# Patient Record
Sex: Male | Born: 2000 | Race: White | Hispanic: No | Marital: Single | Smoking: Never smoker
Health system: Southern US, Community
[De-identification: ages and names within clinical notes are randomized; demographics above are authoritative.]

## PROBLEM LIST (undated history)

## (undated) HISTORY — PX: TONSILLECTOMY: SUR1361

---
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
## 2019-11-15 ENCOUNTER — Emergency Department: Payer: Worker's Compensation

## 2019-11-15 ENCOUNTER — Emergency Department
Admission: EM | Admit: 2019-11-15 | Discharge: 2019-11-15 | Disposition: A | Discharge status: Home / Self Care | Payer: Worker's Compensation | Attending: Emergency Medicine | Admitting: Emergency Medicine

## 2019-11-15 ENCOUNTER — Encounter: Payer: Self-pay | Admitting: Emergency Medicine

## 2019-11-15 ENCOUNTER — Other Ambulatory Visit: Payer: Self-pay

## 2019-11-15 DIAGNOSIS — W228XXA Striking against or struck by other objects, initial encounter: Secondary | ICD-10-CM | POA: Insufficient documentation

## 2019-11-15 DIAGNOSIS — S0003XA Contusion of scalp, initial encounter: Secondary | ICD-10-CM | POA: Insufficient documentation

## 2019-11-15 DIAGNOSIS — Y990 Civilian activity done for income or pay: Secondary | ICD-10-CM | POA: Diagnosis not present

## 2019-11-15 DIAGNOSIS — Y9289 Other specified places as the place of occurrence of the external cause: Secondary | ICD-10-CM | POA: Diagnosis not present

## 2019-11-15 DIAGNOSIS — Y9389 Activity, other specified: Secondary | ICD-10-CM | POA: Diagnosis not present

## 2019-11-15 DIAGNOSIS — S060X0A Concussion without loss of consciousness, initial encounter: Secondary | ICD-10-CM | POA: Insufficient documentation

## 2019-11-15 DIAGNOSIS — S0990XA Unspecified injury of head, initial encounter: Secondary | ICD-10-CM | POA: Diagnosis present

## 2019-11-15 MED ORDER — ONDANSETRON 4 MG PO TBDP
4.0000 mg | ORAL_TABLET | Freq: Three times a day (TID) | ORAL | 0 refills | Status: AC | PRN
Start: 2019-11-15 — End: ?

## 2019-11-15 MED ORDER — ONDANSETRON 4 MG PO TBDP
4.0000 mg | ORAL_TABLET | Freq: Once | ORAL | Status: AC
  Administered 2019-11-15: 4 mg via ORAL
  Filled 2019-11-15: qty 1

## 2019-11-15 MED ORDER — MECLIZINE HCL 25 MG PO TABS: 25.0000 mg | ORAL_TABLET | Freq: Three times a day (TID) | ORAL | 0 refills | Status: AC | PRN

## 2019-11-15 NOTE — ED Triage Notes (Signed)
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 No LOC. Is having headaches and nausea so came to get checked. No blood thinners. No profile on file for company.

## 2019-11-15 NOTE — ED Notes (Signed)
See triage note states he hit his head on a door at work yesterday  No LOC  conts to have h/a

## 2019-11-15 NOTE — Discharge Instructions (Addendum)
Follow-up with your primary care provider, [facility] acute care or return to the emergency department if any continued problems with headaches.  Begin taking Zofran as needed for nausea.  Antivert is 3 times a day if needed for dizziness.  You may also take Tylenol if needed for headache.  Drink lots of fluids.  You may need to avoid bright light, texting on your cell phone, watching TV, or playing video games in order for your headache to improve.  You are out of work for the next 2 days to avoid increased chances of injury due to your headache and dizziness.  If there is any worsening of your symptoms or dizziness is not improving return to the emergency department.

## 2019-11-15 NOTE — ED Provider Notes (Signed)
[facility] Emergency Department Provider Note   ____________________________________________   First MD Initiated Contact with Patient 11/15/19 1044     (approximate)  I have reviewed the triage vital signs and the nursing notes.   HISTORY  Chief Complaint Head Injury   HPI DVON JILES is a 19 y.o. male presents to the ED with complaint of headache, nausea without vomiting and dizziness.  Patient states that while at work yesterday a sliding door hit him to the posterior aspect of his head.  He denies any LOC.  He has continued to have headaches since that time.  Patient is not currently taking blood thinners.       History reviewed. No pertinent past medical history.  There are no problems to display for this patient.   Past Surgical History:  Procedure Laterality Date  . TONSILLECTOMY      Prior to Admission medications   Medication Sig Start Date End Date Taking? Authorizing Provider  meclizine (ANTIVERT) 25 MG tablet Take 1 tablet (25 mg total) by mouth 3 (three) times daily as needed for dizziness. 11/15/19   Tommi Rumps, PA-C  ondansetron (ZOFRAN ODT) 4 MG disintegrating tablet Take 1 tablet (4 mg total) by mouth every 8 (eight) hours as needed for nausea or vomiting. 11/15/19   Tommi Rumps, PA-C    Allergies Amoxicillin-pot clavulanate and Cephalexin  History reviewed. No pertinent family history.  Social History Social History   Tobacco Use  . Smoking status: Never Smoker  . Smokeless tobacco: Never Used  Substance Use Topics  . Alcohol use: Never  . Drug use: Never    Review of Systems Constitutional: No fever/chills Eyes: No visual changes. ENT: No trauma. Cardiovascular: Denies chest pain. Respiratory: Denies shortness of breath. Gastrointestinal: No abdominal pain.  Positive nausea, no vomiting.  Musculoskeletal: Negative for muscle aches. Skin: Negative for rash. Neurological: Positive for headaches,  negative for focal weakness or numbness. ____________________________________________   PHYSICAL EXAM:  VITAL SIGNS: ED Triage Vitals  Enc Vitals Group     BP 11/15/19 1038 (!) 142/84     Pulse Rate 11/15/19 1038 71     Resp 11/15/19 1038 18     Temp 11/15/19 1038 99.2 F (37.3 C)     Temp Source 11/15/19 1038 Oral     SpO2 11/15/19 1038 99 %     Weight 11/15/19 1039 220 lb (99.8 kg)     Height 11/15/19 1039 6\' 1"  (1.854 m)     Head Circumference --      Peak Flow --      Pain Score 11/15/19 1038 0     Pain Loc --      Pain Edu? --      Excl. in GC? --     Constitutional: Alert and oriented. Well appearing and in no acute distress. Eyes: Conjunctivae are normal. PERRL. EOMI. mild nystagmus was noted. Head: Atraumatic. Nose: No trauma. Neck: No stridor.  No cervical tenderness on palpation posteriorly. Cardiovascular: Normal rate, regular rhythm. Grossly normal heart sounds.  Good peripheral circulation. Respiratory: Normal respiratory effort.  No retractions. Lungs CTAB. Gastrointestinal: Soft and nontender. No distention.  Musculoskeletal: Moves upper and lower extremities without any difficulty.  Normal gait was noted. Neurologic:  Normal speech and language. No gross focal neurologic deficits are appreciated.  Cranial nerves II through XII grossly intact.  No gait instability. Skin:  Skin is warm, dry and intact.  Tender left posterior scalp without abrasion  or hematoma. Psychiatric: Mood and affect are normal. Speech and behavior are normal.  ____________________________________________   LABS (all labs ordered are listed, but only abnormal results are displayed)  Labs Reviewed - No data to display RADIOLOGY   Official radiology report(s): CT Head Wo Contrast  Result Date: 11/15/2019 CLINICAL DATA:  Headache. Traumatic head injury yesterday. Hit by sliding door at work. EXAM: CT HEAD WITHOUT CONTRAST TECHNIQUE: Contiguous axial images were obtained from the base  of the skull through the vertex without intravenous contrast. COMPARISON:  CT head without contrast 04/28/2018 FINDINGS: Brain: No acute infarct, hemorrhage, or mass lesion is present. No significant white matter lesions are present. The ventricles are of normal size. No significant extraaxial fluid collection is present. The brainstem and cerebellum are within normal limits. Vascular: No hyperdense vessel or unexpected calcification. Skull: Calvarium is intact. No focal lytic or blastic lesions are present. No significant extracranial soft tissue lesion is present. Sinuses/Orbits: The paranasal sinuses and mastoid air cells are clear. The globes and orbits are within normal limits. IMPRESSION: Negative CT of the head. Electronically Signed   By: San Morelle M.D.   On: 11/15/2019 11:34    ____________________________________________   PROCEDURES  Procedure(s) performed (including Critical Care):  Procedures ____________________________________________   INITIAL IMPRESSION / ASSESSMENT AND PLAN / ED COURSE  As part of my medical decision making, I reviewed the following data within the electronic MEDICAL RECORD NUMBER Notes from prior ED visits and [place] Controlled Substance Database  19 year old male presents to the ED with complaint of headache, nausea and dizziness after he was hit at work by a sliding door.  Patient denies any LOC.  He has continued to have headache and currently is complaining of nausea.   ____________________________________________   FINAL CLINICAL IMPRESSION(S) / ED DIAGNOSES  Final diagnoses:  Concussion without loss of consciousness, initial encounter  Contusion of scalp, initial encounter     ED Discharge Orders         Ordered    ondansetron (ZOFRAN ODT) 4 MG disintegrating tablet  Every 8 hours PRN     11/15/19 1233    meclizine (ANTIVERT) 25 MG tablet  3 times daily PRN     11/15/19 1233           Note:  This document was prepared using Dragon  voice recognition software and may include unintentional dictation errors.    Johnn Hai, PA-C 11/15/19 1252    Earleen Newport, MD 11/15/19 1430

## 2019-11-23 ENCOUNTER — Encounter: Payer: Self-pay | Admitting: Emergency Medicine

## 2019-11-23 ENCOUNTER — Emergency Department
Admission: EM | Admit: 2019-11-23 | Discharge: 2019-11-23 | Disposition: A | Discharge status: Home / Self Care | Payer: Worker's Compensation | Attending: Emergency Medicine | Admitting: Emergency Medicine

## 2019-11-23 ENCOUNTER — Other Ambulatory Visit: Payer: Self-pay

## 2019-11-23 DIAGNOSIS — R42 Dizziness and giddiness: Secondary | ICD-10-CM | POA: Diagnosis not present

## 2019-11-23 DIAGNOSIS — F0781 Postconcussional syndrome: Secondary | ICD-10-CM | POA: Diagnosis not present

## 2019-11-23 DIAGNOSIS — R519 Headache, unspecified: Secondary | ICD-10-CM | POA: Insufficient documentation

## 2019-11-23 NOTE — ED Provider Notes (Signed)
[facility] Emergency Department Provider Note ____________________________________________  Time seen: 1209  I have reviewed the triage vital signs and the nursing notes.  HISTORY  Chief Complaint  Dizziness  HPI Devin Cameron is a 19 y.o. male presents himself to the ED for ongoing evaluation of dizziness and mild headache.  Patient was evaluated a week earlier forehead contusion and subsequent concussion.  He was evaluated with a CT which was negative at that time.  Patient admits to not taking the prescribed meclizine or ondansetron as prescribed.  He had been taking naproxen intermittently for his headache pain relief.   He denies any ongoing vomiting, syncope, weakness, visual disturbance, or tinnitus. He gives a history of being recently released from neurology, after an 26-month outpatient therapy regimen. He states he knows he should take the medicine as prescribed, but admits to being non-compliant. He denies interim injury or trauma.   History reviewed. No pertinent past medical history.  There are no problems to display for this patient.   Past Surgical History:  Procedure Laterality Date  . TONSILLECTOMY      Prior to Admission medications   Medication Sig Start Date End Date Taking? Authorizing Provider  meclizine (ANTIVERT) 25 MG tablet Take 1 tablet (25 mg total) by mouth 3 (three) times daily as needed for dizziness. 11/15/19   Johnn Hai, PA-C  ondansetron (ZOFRAN ODT) 4 MG disintegrating tablet Take 1 tablet (4 mg total) by mouth every 8 (eight) hours as needed for nausea or vomiting. 11/15/19   Johnn Hai, PA-C    Allergies Amoxicillin-pot clavulanate and Cephalexin  History reviewed. No pertinent family history.  Social History Social History   Tobacco Use  . Smoking status: Never Smoker  . Smokeless tobacco: Never Used  Vaping Use  . Vaping Use: Never assessed  Substance Use Topics  . Alcohol use: Never  . Drug use:  Never    Review of Systems  Constitutional: Negative for fever. Eyes: Negative for visual changes. ENT: Negative for sore throat. Reports mild dizziness. Cardiovascular: Negative for chest pain. Respiratory: Negative for shortness of breath. Gastrointestinal: Negative for abdominal pain, vomiting and diarrhea. Genitourinary: Negative for dysuria. Musculoskeletal: Negative for back pain. Skin: Negative for rash. Neurological: Positive for headaches. Denies focal weakness or numbness. ____________________________________________  PHYSICAL EXAM:  VITAL SIGNS: ED Triage Vitals  Enc Vitals Group     BP 11/23/19 1056 137/72     Pulse Rate 11/23/19 1056 63     Resp 11/23/19 1056 18     Temp 11/23/19 1056 98.7 F (37.1 C)     Temp Source 11/23/19 1056 Oral     SpO2 11/23/19 1056 100 %     Weight 11/23/19 1055 220 lb (99.8 kg)     Height 11/23/19 1055 6\' 1"  (1.854 m)     Head Circumference --      Peak Flow --      Pain Score 11/23/19 1055 0     Pain Loc --      Pain Edu? --      Excl. in [place]? --     Constitutional: Alert and oriented. Well appearing and in no distress. GCS=15 Head: Normocephalic and atraumatic. Eyes: Conjunctivae are normal. PERRL. Normal extraocular movements and fundi bilaterally Ears: Canals clear. TMs intact bilaterally. Neck: Supple. Normal ROM without crepitus, nuchal rigidity, or midline tenderness. Cardiovascular: Normal rate, regular rhythm. Normal distal pulses. Respiratory: Normal respiratory effort. No wheezes/rales/rhonchi. Gastrointestinal: Soft and nontender. No distention.  Musculoskeletal: Nontender with normal range of motion in all extremities.  Neurologic: CN II-XII grossly intact. Normal gait without ataxia. Normal speech and language. No gross focal neurologic deficits are appreciated. Skin:  Skin is warm, dry and intact. No rash noted. Psychiatric: Mood and affect are normal. Patient exhibits appropriate insight and  judgment. ____________________________________________  PROCEDURES  Procedures ____________________________________________  INITIAL IMPRESSION / ASSESSMENT AND PLAN / ED COURSE  Patient with ED evaluation of ongoing intermittent headache and dizziness following a recent head injury.  Patient was evaluated last week, 1 day after the incident with a normal exam and negative head CT.  He has admitted to not taking the prescription medications as prescribed, in the interim.  He presents today with ongoing symptom complaint.  Exam is overall benign reassuring at this time.  No signs of acute neuromuscular deficit.  No signs of cerebellar ataxia or other intracranial process.  Patient's exam is reassuring and patient is agreeable to take the prescribed medications.  He is again given instruction on the typically self-limited course of a concussion.  He will follow with neurology or return to the ED as needed.  Work activities have been suspended for the remainder of this week.  Devin Cameron was evaluated in Emergency Department on 11/23/2019 for the symptoms described in the history of present illness. He was evaluated in the context of the global COVID-19 pandemic, which necessitated consideration that the patient might be at risk for infection with the SARS-CoV-2 virus that causes COVID-19. Institutional protocols and algorithms that pertain to the evaluation of patients at risk for COVID-19 are in a state of rapid change based on information released by regulatory bodies including the CDC and federal and state organizations. These policies and algorithms were followed during the patient's care in the ED. ____________________________________________  FINAL CLINICAL IMPRESSION(S) / ED DIAGNOSES  Final diagnoses:  Post concussion syndrome      Lissa Hoard, PA-C 11/23/19 1415    Sharyn Creamer, MD 11/23/19 1643

## 2019-11-23 NOTE — Discharge Instructions (Addendum)
Your exam is normal at this time. You have not taken the prescription meds that were prescribed, which has caused your symptoms to continue. Take the Meclizine and Zofran as directed. Take OTC Tylenol (1000 mg PO) 3 times a day, and Naproxen (440 mg PO) 2 times a day for headache and dizziness. Follow-up with your Neurologist for continued symptoms.

## 2019-11-23 NOTE — ED Triage Notes (Signed)
Pt reports was seen in the ED last week after getting ht in the head with a door. Pt reports that he still has a weird feeling in his head and is here for a follow up. Denies NV

## 2020-11-10 IMAGING — CT CT HEAD W/O CM
3 of 4 series · 16 of 47 positions shown, 19 images · non-contrast
Comparison: CT head without contrast 04/28/2018

CLINICAL DATA: Headache. Traumatic head injury yesterday. Hit by
sliding door at work.

EXAM:
CT HEAD WITHOUT CONTRAST
TECHNIQUE: Contiguous axial images were obtained from the base of the skull
through the vertex without intravenous contrast.

[Series 3: head wo · axial · 0.41mm/px · z∈[-149,-24]mm · 10 of 31 slices shown, 13 images]
[im 3/31  brain]
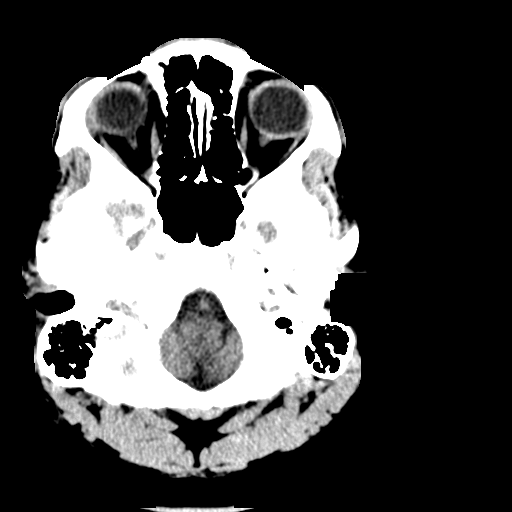
[im 3/31  bone]
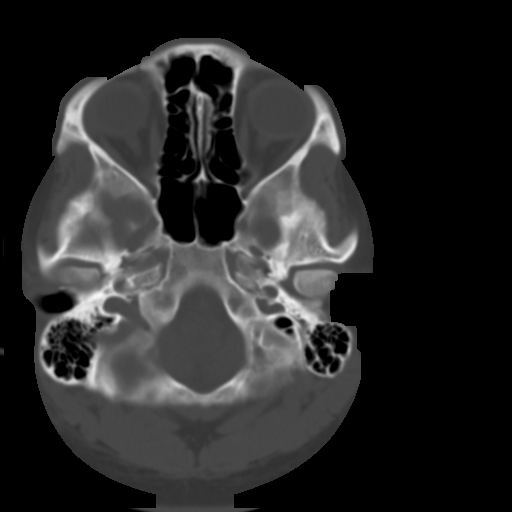
[im 5/31  brain]
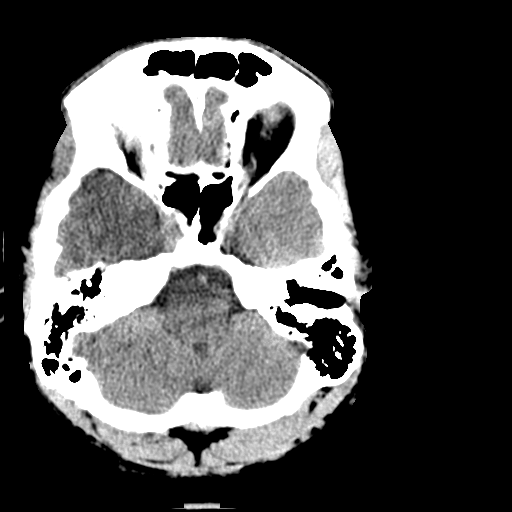
[im 9/31  brain]
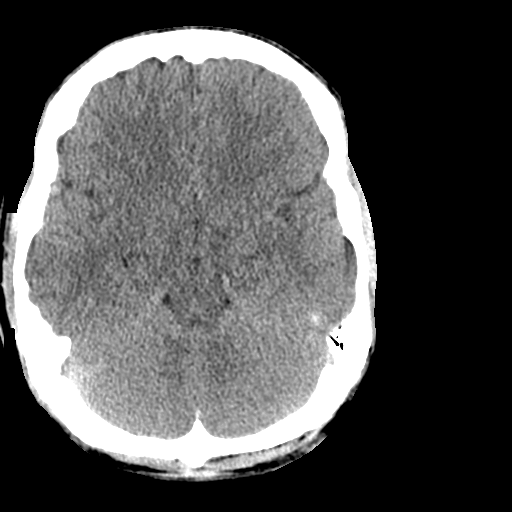
[im 11/31  brain]
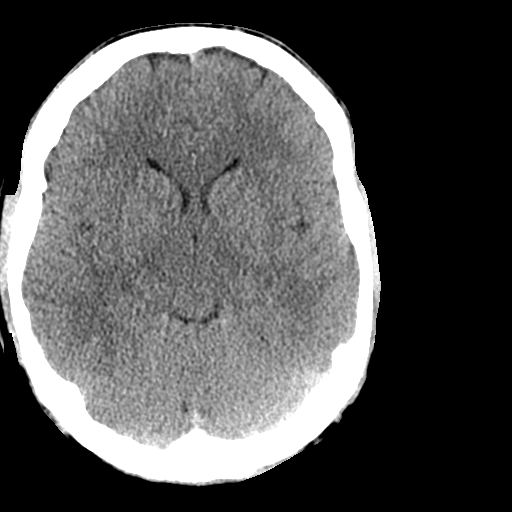
[im 13/31  brain]
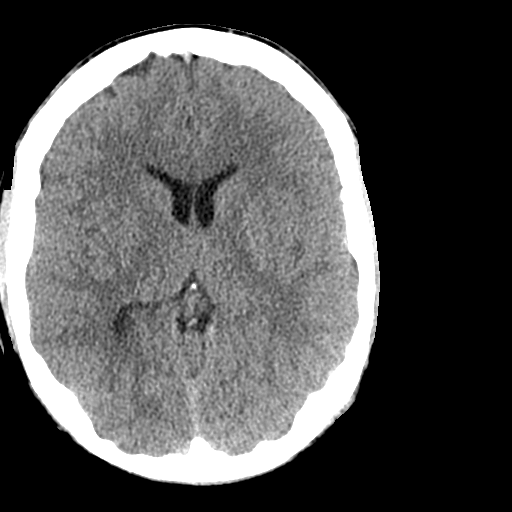
[im 13/31  bone]
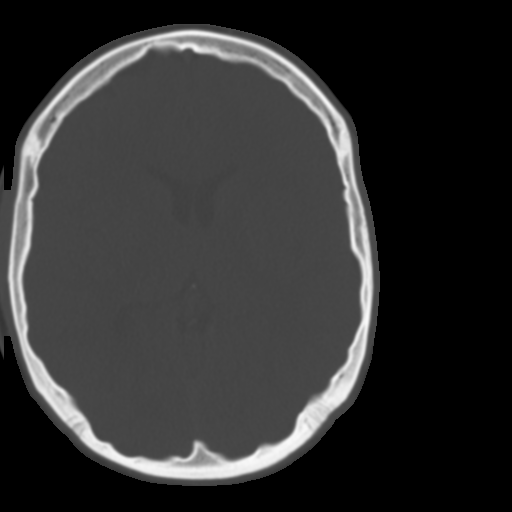
[im 18/31  brain]
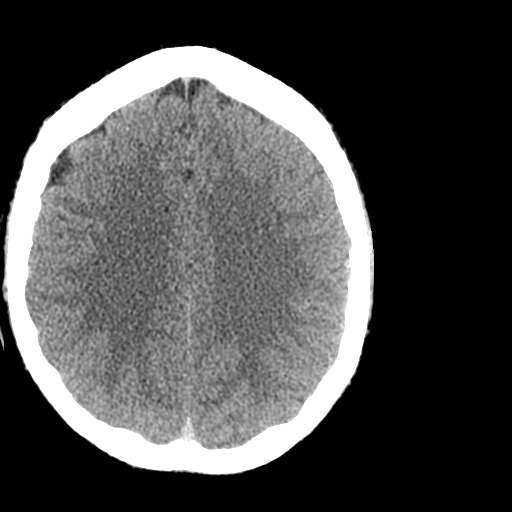
[im 20/31  brain]
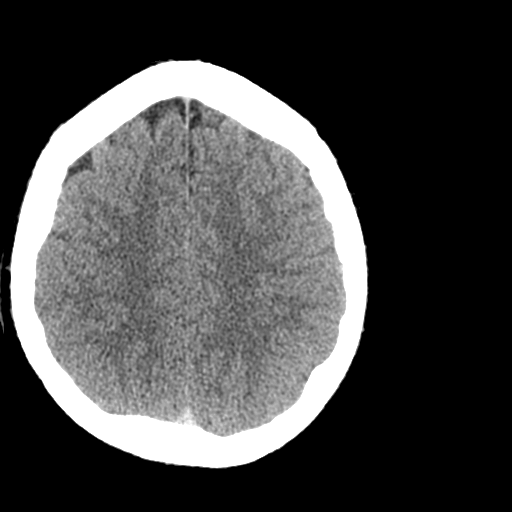
[im 22/31  brain]
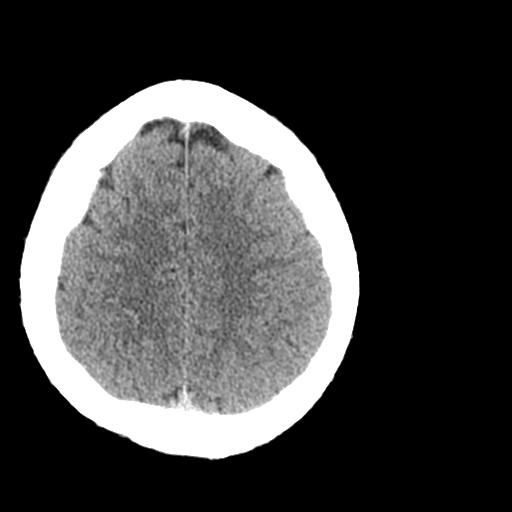
[im 26/31  brain]
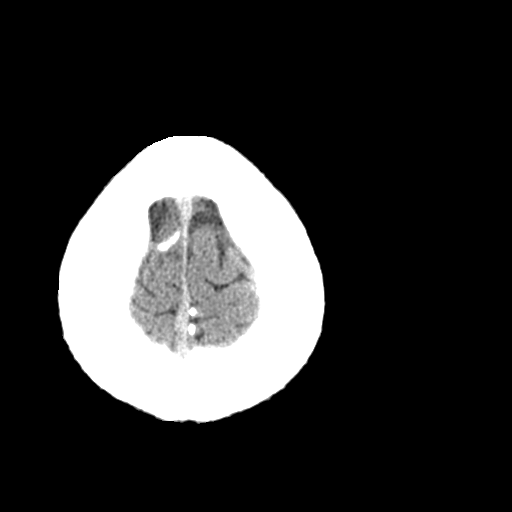
[im 26/31  bone]
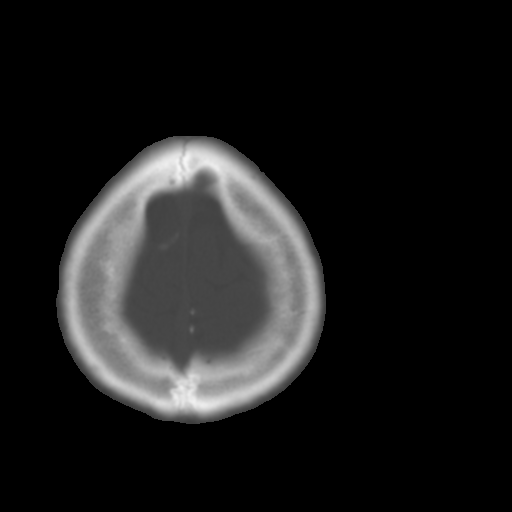
[im 28/31  brain]
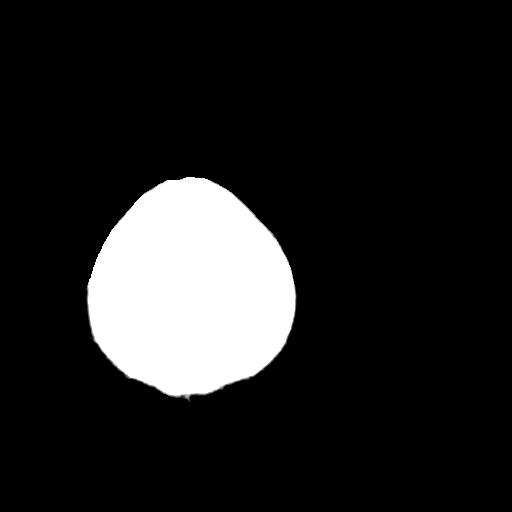

[Series 4: coronal soft tissue · coronal · 0.37mm/px · 3 of 69 slices shown]
[im 23/69  brain]
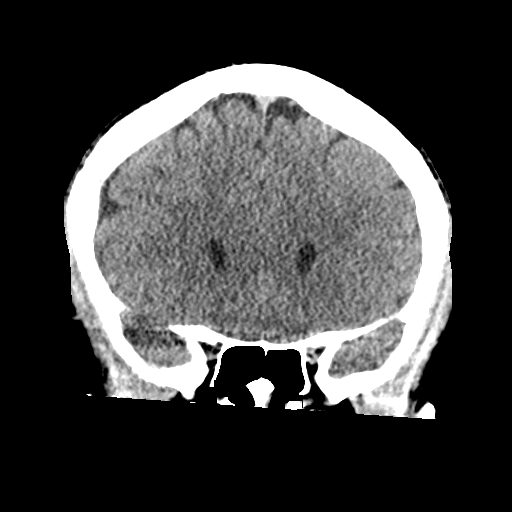
[im 31/69  brain]
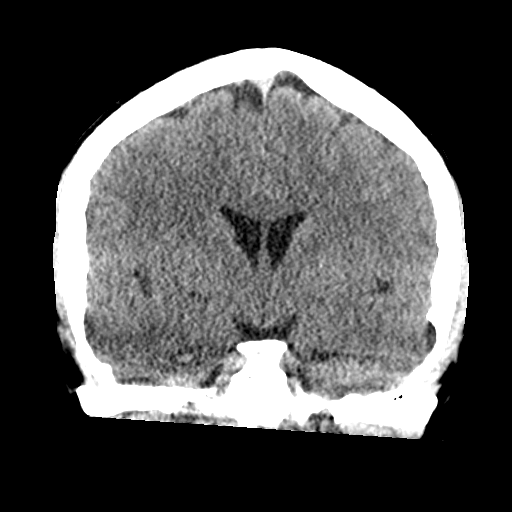
[im 38/69  brain]
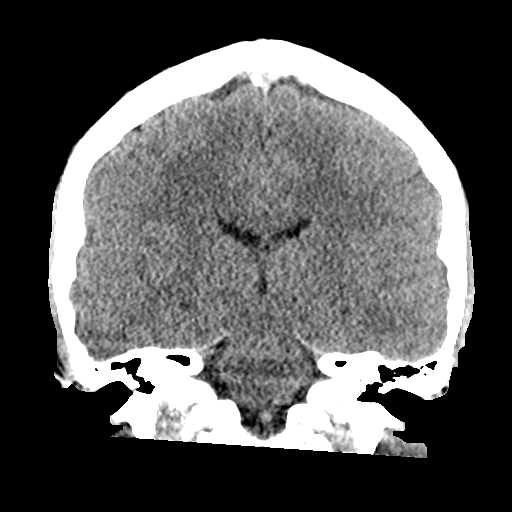

[Series 5: sagittal soft tissue · sagittal · 0.38mm/px · 3 of 59 slices shown]
[im 20/59  brain]
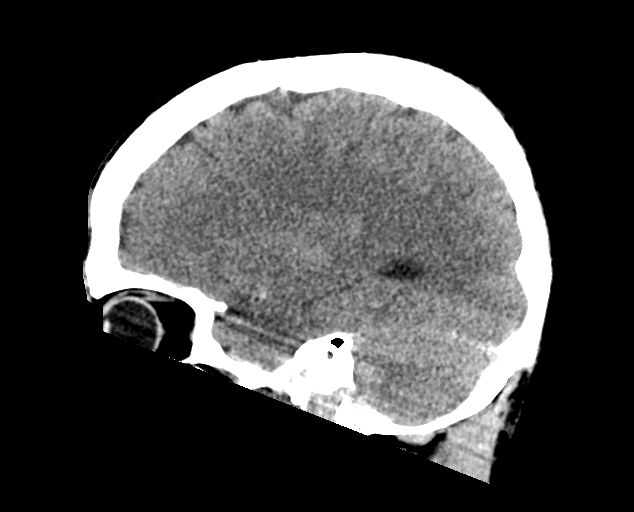
[im 30/59  brain]
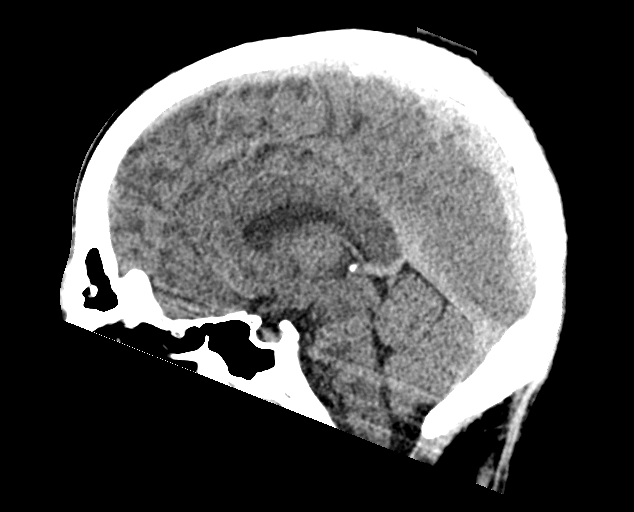
[im 39/59  brain]
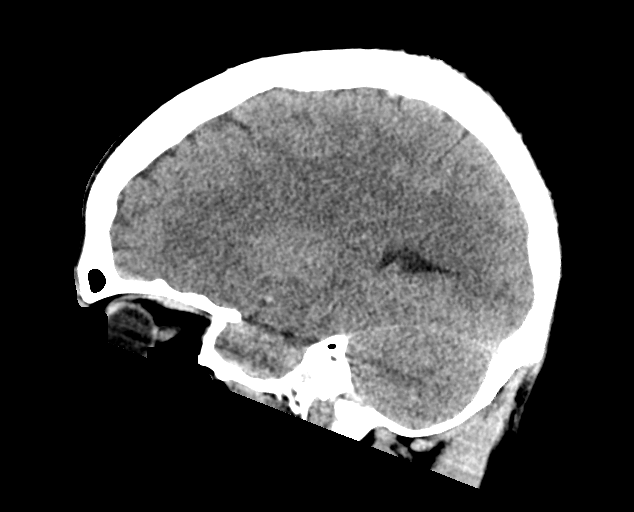

[16 of 47 positions shown; findings below may reference images not displayed]

FINDINGS: Brain: No acute infarct, hemorrhage, or mass lesion is present. No
significant white matter lesions are present. The ventricles are of
normal size. No significant extraaxial fluid collection is present.
The brainstem and cerebellum are within normal limits.

Vascular: No hyperdense vessel or unexpected calcification.

Skull: Calvarium is intact. No focal lytic or blastic lesions are
present. No significant extracranial soft tissue lesion is present.

Sinuses/Orbits: The paranasal sinuses and mastoid air cells are
clear. The globes and orbits are within normal limits.
IMPRESSION: Negative CT of the head.
